# Patient Record
Sex: Female | Born: 1970 | Race: Black or African American | Hispanic: No | Marital: Single | Smoking: Never smoker
Health system: Southern US, Community
[De-identification: ages and names within clinical notes are randomized; demographics above are authoritative.]

---

## 2020-05-07 ENCOUNTER — Other Ambulatory Visit: Payer: Self-pay

## 2020-05-07 ENCOUNTER — Encounter: Payer: Self-pay | Admitting: Podiatry

## 2020-05-07 ENCOUNTER — Ambulatory Visit (INDEPENDENT_AMBULATORY_CARE_PROVIDER_SITE_OTHER): Payer: Managed Care, Other (non HMO) | Admitting: Podiatry

## 2020-05-07 ENCOUNTER — Other Ambulatory Visit: Payer: Self-pay | Admitting: Podiatry

## 2020-05-07 ENCOUNTER — Encounter: Payer: Self-pay | Admitting: *Deleted

## 2020-05-07 ENCOUNTER — Ambulatory Visit (INDEPENDENT_AMBULATORY_CARE_PROVIDER_SITE_OTHER): Payer: Managed Care, Other (non HMO)

## 2020-05-07 DIAGNOSIS — M76822 Posterior tibial tendinitis, left leg: Secondary | ICD-10-CM

## 2020-05-07 DIAGNOSIS — M778 Other enthesopathies, not elsewhere classified: Secondary | ICD-10-CM

## 2020-05-07 MED ORDER — MELOXICAM 15 MG PO TABS: 15.0000 mg | ORAL_TABLET | Freq: Every day | ORAL | 3 refills | Status: AC

## 2020-05-07 MED ORDER — METHYLPREDNISOLONE 4 MG PO TBPK
ORAL_TABLET | ORAL | 0 refills | Status: DC
Start: 2020-05-07 — End: 2020-06-04

## 2020-05-07 NOTE — Progress Notes (Signed)
  Subjective:  Patient ID: Autumn Jackson, female    DOB: 1971-09-18,  MRN: [id number] HPI Chief Complaint  Patient presents with  . Foot Pain    Medial foot left - aching x 6 months, no injury, swelling some, tried OTC meds, KT tape, different shoes-no help, stands at work all day and has to sit after about 3 hours  . New Patient (Initial Visit)    49 y.o. female presents with the above complaint.   ROS: Denies fever chills nausea vomiting muscle aches pains calf pain back pain chest pain shortness of breath.  No past medical history on file.   Current Outpatient Medications:  .  ibuprofen (ADVIL) 800 MG tablet, Take 800 mg by mouth every 8 (eight) hours as needed., Disp: , Rfl:  .  PRESCRIPTION MEDICATION, Unknown antibiotic for tooth infection, Disp: , Rfl:  .  meloxicam (MOBIC) 15 MG tablet, Take 1 tablet (15 mg total) by mouth daily., Disp: 30 tablet, Rfl: 3 .  methylPREDNISolone (MEDROL DOSEPAK) 4 MG TBPK tablet, 6 day dose pack - take as directed, Disp: 21 tablet, Rfl: 0  No Known Allergies Review of Systems Objective:  There were no vitals filed for this visit.  General: Well developed, nourished, in no acute distress, alert and oriented x3   Dermatological: Skin is warm, dry and supple bilateral. Nails x 10 are well maintained; remaining integument appears unremarkable at this time. There are no open sores, no preulcerative lesions, no rash or signs of infection present.  Vascular: Dorsalis Pedis artery and Posterior Tibial artery pedal pulses are 2/4 bilateral with immedate capillary fill time. Pedal hair growth present. No varicosities and no lower extremity edema present bilateral.   Neruologic: Grossly intact via light touch bilateral. Vibratory intact via tuning fork bilateral. Protective threshold with Semmes Wienstein monofilament intact to all pedal sites bilateral. Patellar and Achilles deep tendon reflexes 2+ bilateral. No Babinski or clonus noted bilateral.    Musculoskeletal: No gross boney pedal deformities bilateral. No pain, crepitus, or limitation noted with foot and ankle range of motion bilateral. Muscular strength 5/5 in all groups tested bilateral.  Flexible pes planus bilateral she has pain on palpation of the posterior tibial tendon as it courses beneath the medial malleolus extending to the navicular tuberosity and plantarly.  Inversion against resistance is painful.  Gait: Unassisted, Nonantalgic.    Radiographs:  Radiographs demonstrate pes planus with a soft tissue increase in density along the posterior tibial tendon course headed toward the navicular.  No fractures are identified.  Pes planus does not demonstrate any type of coalitions.  Small plantar distally already calcaneal heel spurs present.  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  Plan: Discussed etiology pathology conservative versus surgical therapies.  I injected subcutaneously today 20 mg of Kenalog 5 mg of Marcaine at the point of maximal tenderness.  Making sure not to inject into the tendon she tolerated the procedure well.  Start her on a Medrol Dosepak to be followed by meloxicam placed her in a short cam walker.  Provided her with a night splint and an ice pack.  I will follow-up with her in 1 month she will have questions or concerns she will notify us immediately.  We did discuss the possible need for orthosis or an MRI depending on the relief she obtains.     Braidyn Scorsone T. [place], [place]

## 2020-05-18 DIAGNOSIS — M79676 Pain in unspecified toe(s): Secondary | ICD-10-CM

## 2020-05-30 ENCOUNTER — Telehealth: Payer: Self-pay | Admitting: Podiatry

## 2020-05-30 NOTE — Telephone Encounter (Signed)
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 She is constantly on her feet. Her job does not have any light duty. She is scheduled to return to see you 7/21. Is is ok for me to do FMLA forms from 6/22-7/22.

## 2020-05-30 NOTE — Telephone Encounter (Signed)
Yes that will be fine and you might want to extend a little.

## 2020-05-31 DIAGNOSIS — M79676 Pain in unspecified toe(s): Secondary | ICD-10-CM

## 2020-06-01 DIAGNOSIS — M79676 Pain in unspecified toe(s): Secondary | ICD-10-CM

## 2020-06-04 ENCOUNTER — Encounter: Payer: Self-pay | Admitting: Podiatry

## 2020-06-04 ENCOUNTER — Other Ambulatory Visit: Payer: Self-pay

## 2020-06-04 ENCOUNTER — Ambulatory Visit (INDEPENDENT_AMBULATORY_CARE_PROVIDER_SITE_OTHER): Payer: Managed Care, Other (non HMO) | Admitting: Podiatry

## 2020-06-04 DIAGNOSIS — M76822 Posterior tibial tendinitis, left leg: Secondary | ICD-10-CM | POA: Diagnosis not present

## 2020-06-04 NOTE — Progress Notes (Signed)
She presents today stating that her posterior tibial tendinitis is about 50 to 60% improved she states is much better she says is much more tolerable than it has been in the past and she is extremely happy.  She states that she continues to take the meloxicam on a regular basis and has a couple refills left.  She denies fever chills nausea vomiting muscle aches and pains denies any new problems.  Objective: Signs are stable she alert oriented x3 still has significant swelling in the medial ankle with pain on palpation of the navicular tuberosity and the inferior medial malleolus area.  Assessment: Posterior tibial tendon dysfunction and posterior tibial tendinitis at its insertion site navicular.  Plan: Discussed etiology pathology and surgical therapies this point time I feel that it is necessary to get her into a orthotic.  I like to use a deep heel cup with a medial navicular flange.  She will follow up with Raiford Noble for this.

## 2020-06-12 ENCOUNTER — Other Ambulatory Visit: Payer: Self-pay

## 2020-06-12 ENCOUNTER — Ambulatory Visit (INDEPENDENT_AMBULATORY_CARE_PROVIDER_SITE_OTHER): Payer: Managed Care, Other (non HMO) | Admitting: Orthotics

## 2020-06-12 DIAGNOSIS — M76822 Posterior tibial tendinitis, left leg: Secondary | ICD-10-CM

## 2020-06-12 DIAGNOSIS — M216X1 Other acquired deformities of right foot: Secondary | ICD-10-CM | POA: Diagnosis not present

## 2020-06-12 DIAGNOSIS — M216X2 Other acquired deformities of left foot: Secondary | ICD-10-CM

## 2020-06-12 NOTE — Progress Notes (Signed)
Cast for f/o to address PTTD Left; plan on deep heel cup w medial flange.

## 2020-07-03 ENCOUNTER — Ambulatory Visit: Payer: Managed Care, Other (non HMO) | Admitting: Orthotics

## 2020-07-03 ENCOUNTER — Other Ambulatory Visit: Payer: Self-pay

## 2020-07-03 DIAGNOSIS — M76822 Posterior tibial tendinitis, left leg: Secondary | ICD-10-CM

## 2020-07-03 NOTE — Progress Notes (Signed)

## 2020-07-12 ENCOUNTER — Encounter: Payer: Self-pay | Admitting: Podiatry

## 2020-07-12 ENCOUNTER — Other Ambulatory Visit: Payer: Self-pay

## 2020-07-12 ENCOUNTER — Ambulatory Visit (INDEPENDENT_AMBULATORY_CARE_PROVIDER_SITE_OTHER): Payer: Managed Care, Other (non HMO) | Admitting: Podiatry

## 2020-07-12 DIAGNOSIS — M76822 Posterior tibial tendinitis, left leg: Secondary | ICD-10-CM | POA: Diagnosis not present

## 2020-07-12 NOTE — Progress Notes (Signed)
Autumn Jackson presents today for follow-up of her posterior tibial tendinitis and tendon dysfunction left.  She has recently picked up her orthotics and has not been back to work as of yet states that as long as I wear the orthotics my foot feels great I have no problems whatsoever.  States that she continues to wear her new balance tennis shoes.  Objective: Vital signs are stable alert oriented x3 left foot demonstrates no edema no pain on palpation of posterior tibial tendon no swelling of the ankle.  Assessment: Well-healing posterior tibial tendinitis and pes planus left with use of orthotics.  Plan: Continued use of orthotics to go back to work Monday.

## 2020-07-17 ENCOUNTER — Encounter: Payer: Self-pay | Admitting: Podiatry

## 2020-07-17 ENCOUNTER — Ambulatory Visit (INDEPENDENT_AMBULATORY_CARE_PROVIDER_SITE_OTHER): Payer: Managed Care, Other (non HMO) | Admitting: Podiatry

## 2020-07-17 ENCOUNTER — Other Ambulatory Visit: Payer: Self-pay

## 2020-07-17 ENCOUNTER — Ambulatory Visit (INDEPENDENT_AMBULATORY_CARE_PROVIDER_SITE_OTHER): Payer: Managed Care, Other (non HMO)

## 2020-07-17 VITALS — BP 110/75 | HR 65 | Temp 97.3°F | Resp 16

## 2020-07-17 DIAGNOSIS — M76822 Posterior tibial tendinitis, left leg: Secondary | ICD-10-CM

## 2020-07-17 DIAGNOSIS — IMO0002 Reserved for concepts with insufficient information to code with codable children: Secondary | ICD-10-CM

## 2020-07-17 MED ORDER — METHYLPREDNISOLONE 4 MG PO TBPK: ORAL_TABLET | ORAL | 0 refills | Status: DC

## 2020-07-17 MED ORDER — OXYCODONE-ACETAMINOPHEN 10-325 MG PO TABS: 1.0000 | ORAL_TABLET | Freq: Three times a day (TID) | ORAL | 0 refills | Status: DC | PRN

## 2020-07-17 NOTE — Progress Notes (Signed)
She presents today as a urgent work in for her right foot.  She states that she went back to work for 1 day after I saw her last week and she worked Monday and now the medial dorsal and the top of her foot are exquisitely tender she states that she cannot even put her foot to the ground without the boot on.  She denies fever chills nausea vomiting muscle aches and pains other than her foot being painful.  The foot is warm warm she says and hardly will fit into her boot.  Objective: Vital signs are stable she is alert and oriented x3.  Pulses are palpable.  She has edema and mild erythema no cellulitis drainage or odor it is not overly hot to the touch but the posterior tibial tendon is nonfunctional.  Assessment: Probable rupture of the posterior tibial tendon left.  No calf pain.  Plan: At this point I am going to send her for an MRI of her left foot.  Rule out posterior tibial tendon tear.  At this point I started her on a Medrol Dosepak and Percocet.  She will continue to wear the boot and use ice.

## 2020-07-18 ENCOUNTER — Telehealth (INDEPENDENT_AMBULATORY_CARE_PROVIDER_SITE_OTHER): Payer: Managed Care, Other (non HMO) | Admitting: Podiatry

## 2020-07-18 ENCOUNTER — Ambulatory Visit: Payer: Managed Care, Other (non HMO) | Admitting: Podiatry

## 2020-07-18 ENCOUNTER — Other Ambulatory Visit: Payer: Self-pay | Admitting: Podiatry

## 2020-07-18 MED ORDER — METHYLPREDNISOLONE 4 MG PO TBPK
ORAL_TABLET | ORAL | 0 refills | Status: DC
Start: 2020-07-18 — End: 2020-08-28

## 2020-07-18 MED ORDER — OXYCODONE-ACETAMINOPHEN 10-325 MG PO TABS: 1.0000 | ORAL_TABLET | Freq: Three times a day (TID) | ORAL | 0 refills | Status: AC | PRN

## 2020-07-18 NOTE — Telephone Encounter (Signed)
Patient called stating she would like prescriptions (Medrol dosepack and Percocet) resent to [facility] in [place], [place]. [address], [place], [place].

## 2020-07-18 NOTE — Telephone Encounter (Signed)
sent 

## 2020-07-20 ENCOUNTER — Other Ambulatory Visit: Payer: Self-pay | Admitting: Podiatry

## 2020-07-25 ENCOUNTER — Ambulatory Visit
Admission: RE | Admit: 2020-07-25 | Discharge: 2020-07-25 | Disposition: A | Discharge status: Home / Self Care | Payer: Managed Care, Other (non HMO) | Source: Ambulatory Visit | Attending: Podiatry | Admitting: Podiatry

## 2020-07-25 ENCOUNTER — Other Ambulatory Visit: Payer: Self-pay

## 2020-07-25 DIAGNOSIS — M76822 Posterior tibial tendinitis, left leg: Secondary | ICD-10-CM

## 2020-07-30 ENCOUNTER — Other Ambulatory Visit: Payer: Self-pay | Admitting: *Deleted

## 2020-07-30 ENCOUNTER — Telehealth: Payer: Self-pay | Admitting: *Deleted

## 2020-07-30 DIAGNOSIS — M76822 Posterior tibial tendinitis, left leg: Secondary | ICD-10-CM

## 2020-07-30 NOTE — Telephone Encounter (Signed)
-----   Message from Elinor Parkinson, [place] sent at 07/25/2020  9:37 AM EDT ----- You could let her know that there appears to be no tear and I would like to consider  PT.  She also has some arthritis.

## 2020-07-30 NOTE — Telephone Encounter (Signed)
Called patient-informed her of results per Dr. Al Corpus. Advised he recommended PT-she would like to give that a try. Will set her up with [facility] in house. Send in order to their office today. They will contact her to schedule.

## 2020-08-07 ENCOUNTER — Encounter: Payer: Managed Care, Other (non HMO) | Admitting: Podiatry

## 2020-08-07 ENCOUNTER — Other Ambulatory Visit: Payer: Self-pay

## 2020-08-07 ENCOUNTER — Ambulatory Visit: Payer: Managed Care, Other (non HMO) | Admitting: Podiatry

## 2020-08-07 NOTE — Progress Notes (Signed)
I have already called patient regarding MRI results at the request of Dr. Al Corpus. He wanted her to start PT. I asked if there was any other reasons for the appointment today and she says no. I explained this appointment wasn't necessary today and to follow back up with him once she is done with PT. She agreed and will call to schedule at that time.

## 2020-08-28 ENCOUNTER — Encounter: Payer: Self-pay | Admitting: Podiatry

## 2020-08-28 ENCOUNTER — Ambulatory Visit (INDEPENDENT_AMBULATORY_CARE_PROVIDER_SITE_OTHER): Payer: Managed Care, Other (non HMO) | Admitting: Podiatry

## 2020-08-28 ENCOUNTER — Other Ambulatory Visit: Payer: Self-pay

## 2020-08-28 DIAGNOSIS — M76822 Posterior tibial tendinitis, left leg: Secondary | ICD-10-CM | POA: Diagnosis not present

## 2020-08-28 NOTE — Progress Notes (Signed)
She presents today for follow-up of her posterior tibial tendinitis states that is doing so much better with physical therapy she says if I twisted her chart it is still hurts but is not nearly as bad as it once was.  Objective: Vital signs are stable she is alert oriented x3 mild edema to the foot.  Still has tenderness on palpation of the posterior tibial tendon.  Still has pain on dorsiflexion plantarflexion inversion and eversion but not nearly as painful as it was previously.  Assessment: Resolving posterior tibial tendinitis.  Plan: Encouraged her to continue with physical therapy and follow-up with me once they have completed.

## 2020-11-15 ENCOUNTER — Other Ambulatory Visit: Payer: Self-pay

## 2020-11-15 ENCOUNTER — Encounter: Payer: Self-pay | Admitting: Podiatry

## 2020-11-15 ENCOUNTER — Ambulatory Visit (INDEPENDENT_AMBULATORY_CARE_PROVIDER_SITE_OTHER): Payer: Managed Care, Other (non HMO) | Admitting: Podiatry

## 2020-11-15 DIAGNOSIS — M76822 Posterior tibial tendinitis, left leg: Secondary | ICD-10-CM | POA: Diagnosis not present

## 2020-11-15 NOTE — Progress Notes (Signed)
She presents today after physical therapy for posterior tibial tendinitis and a severe pes planovalgus.  She states that time she still has instability about the foot but much better range of motion and strength.  She states that when she walks she still like her feels like her foot flattens down along the medial longitudinal arch left over the right.  Objective: Severe pes planus left with some resolving posterior tibial tendinitis she still has some moderate edema she has good inversion against resistance.  No open lesions or wounds.  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  Plan: Discussed etiology pathology conservative surgical therapies at this point I am going to recommend she continue her meloxicam but allow her to get back to work light duty.  I am however going to request that Raiford Noble build up a medial arch on her orthotics the navicular wedge would be good for her I think to help prevent that talonavicular pronation.

## 2020-11-26 ENCOUNTER — Telehealth: Payer: Self-pay | Admitting: Podiatrist

## 2020-11-28 NOTE — Telephone Encounter (Signed)
Patient states she can't return to work until [place] gets her orthotics modified or has a new pair made.

## 2020-12-03 ENCOUNTER — Other Ambulatory Visit: Payer: Managed Care, Other (non HMO) | Admitting: Orthotics

## 2020-12-13 ENCOUNTER — Ambulatory Visit: Payer: Managed Care, Other (non HMO) | Admitting: Orthotics

## 2020-12-13 ENCOUNTER — Other Ambulatory Visit: Payer: Self-pay

## 2020-12-13 DIAGNOSIS — M76822 Posterior tibial tendinitis, left leg: Secondary | ICD-10-CM

## 2020-12-13 NOTE — Progress Notes (Signed)
Adding scaphoid pad to left only

## 2020-12-17 ENCOUNTER — Other Ambulatory Visit: Payer: Self-pay

## 2020-12-17 ENCOUNTER — Ambulatory Visit: Payer: Managed Care, Other (non HMO) | Admitting: Orthotics

## 2020-12-17 DIAGNOSIS — M76822 Posterior tibial tendinitis, left leg: Secondary | ICD-10-CM

## 2020-12-17 NOTE — Progress Notes (Signed)
Picked up adjusted LEFT f/o:  I removed cover and added 1/8" scaphoid pad.

## 2020-12-20 ENCOUNTER — Telehealth: Payer: Self-pay | Admitting: Podiatry

## 2020-12-20 ENCOUNTER — Encounter: Payer: Self-pay | Admitting: Podiatry

## 2020-12-20 NOTE — Telephone Encounter (Signed)
Pt called crying stating today was her first day back at work, and the bottom of her foot is burning. After speaking with Morrie Sheldon, I was given the ok for her to be written out of work until further evaluation. I made her an appt, to see Dr. Samuella Cota tomorrow at 1:15.

## 2020-12-21 ENCOUNTER — Encounter: Payer: Self-pay | Admitting: Podiatry

## 2020-12-21 ENCOUNTER — Other Ambulatory Visit: Payer: Self-pay

## 2020-12-21 ENCOUNTER — Ambulatory Visit (INDEPENDENT_AMBULATORY_CARE_PROVIDER_SITE_OTHER): Payer: Managed Care, Other (non HMO) | Admitting: Podiatry

## 2020-12-21 DIAGNOSIS — M76829 Posterior tibial tendinitis, unspecified leg: Secondary | ICD-10-CM | POA: Diagnosis not present

## 2020-12-21 DIAGNOSIS — Q6652 Congenital pes planus, left foot: Secondary | ICD-10-CM | POA: Diagnosis not present

## 2020-12-21 DIAGNOSIS — M76822 Posterior tibial tendinitis, left leg: Secondary | ICD-10-CM | POA: Diagnosis not present

## 2020-12-21 MED ORDER — METHYLPREDNISOLONE 4 MG PO TBPK: ORAL_TABLET | ORAL | 0 refills | Status: AC

## 2020-12-21 NOTE — Progress Notes (Signed)
  Subjective:  Patient ID: Autumn Jackson, female    DOB: 1971/02/20,  MRN: [id number]  Chief Complaint  Patient presents with  . Tendonitis    Pt states continued plantar midfoot/arch pain which is severe and burning in quality resulting in needing to take frequent breaks at work.     50 y.o. female presents with the above complaint. History confirmed with patient. Has used boot before, now in CMOs. She has had oral medications of both steroid packs x2 and meloxicam without relief.  Objective:  Physical Exam: warm, good capillary refill, no trophic changes or ulcerative lesions, normal DP and PT pulses and normal sensory exam. Left Foot: Tenderness to palpation at the PTT; slight warmth, edema over medial ankle at the PTT. Pes planus noted. Assessment:   1. Posterior tibial tendinitis of left lower extremity   2. Congenital pes planus of left foot   3. PTTD (posterior tibial tendon dysfunction)    Plan:  Patient was evaluated and treated and all questions answered.  Posterior Tibial tendonitis -X-rays reviewed as above -Dispensed Tri-Lock ankle brace.  Patient educated on use  -Rx Medrol pack -Should issues persist order MRI to eval for PT tendon tear  Return in about 3 weeks (around 01/11/2021).

## 2021-01-22 ENCOUNTER — Other Ambulatory Visit: Payer: Self-pay

## 2021-01-22 ENCOUNTER — Ambulatory Visit (INDEPENDENT_AMBULATORY_CARE_PROVIDER_SITE_OTHER): Payer: Managed Care, Other (non HMO) | Admitting: Podiatry

## 2021-01-22 DIAGNOSIS — M76822 Posterior tibial tendinitis, left leg: Secondary | ICD-10-CM

## 2021-01-22 DIAGNOSIS — M66872 Spontaneous rupture of other tendons, left ankle and foot: Secondary | ICD-10-CM | POA: Diagnosis not present

## 2021-01-22 MED ORDER — CELECOXIB 200 MG PO CAPS: 200.0000 mg | ORAL_CAPSULE | Freq: Two times a day (BID) | ORAL | 0 refills | Status: AC

## 2021-01-22 NOTE — Progress Notes (Signed)
  Subjective:  Patient ID: Autumn Jackson, female    DOB: 10-09-1971,  MRN: [id number]  Chief Complaint  Patient presents with  . arch pain    Plantar mid foot arch pain- swelling -redness -painful-burning near the heel-pain is a 5 -87/10    50 y.o. female presents with the above complaint. History confirmed with patient. Brace helps a little bit, the medrol pack did not. Still having pain rated at 6/10.  Objective:  Physical Exam: warm, good capillary refill, no trophic changes or ulcerative lesions, normal DP and PT pulses and normal sensory exam. Left Foot: Tenderness to palpation at the PTT; slight warmth, edema over medial ankle at the PTT. Pes planus noted. Decreased inv strength 4/5 Assessment:   1. Posterior tibial tendinitis of left lower extremity   2. Tibialis posterior tendon tear, nontraumatic, left    Plan:  Patient was evaluated and treated and all questions answered.  Posterior Tibial tendonitis -Continue ankle brace -D/c Meloxicam. Rx celebrex -Order MRI for continued pain and weakness. Has failed oral NSAIDs and steroids, boot immobilization, brace immobilization.  Return in about 3 weeks (around 02/12/2021) for MRI F/u.

## 2021-02-02 ENCOUNTER — Other Ambulatory Visit: Payer: Managed Care, Other (non HMO)

## 2021-02-05 ENCOUNTER — Telehealth: Payer: Self-pay | Admitting: Podiatry

## 2021-02-05 NOTE — Telephone Encounter (Signed)
Called patient Autumn Jackson to schedule appt with Dr. Samuella Cota in the next two weeks so that he can get approval for MRI.

## 2021-02-13 ENCOUNTER — Other Ambulatory Visit: Payer: Self-pay

## 2021-02-13 ENCOUNTER — Ambulatory Visit
Admission: RE | Admit: 2021-02-13 | Discharge: 2021-02-13 | Disposition: A | Discharge status: Home / Self Care | Payer: Managed Care, Other (non HMO) | Source: Ambulatory Visit | Attending: Podiatry | Admitting: Podiatry

## 2021-02-13 DIAGNOSIS — M66872 Spontaneous rupture of other tendons, left ankle and foot: Secondary | ICD-10-CM

## 2021-02-13 DIAGNOSIS — M76822 Posterior tibial tendinitis, left leg: Secondary | ICD-10-CM

## 2021-02-22 ENCOUNTER — Ambulatory Visit (INDEPENDENT_AMBULATORY_CARE_PROVIDER_SITE_OTHER): Payer: Managed Care, Other (non HMO) | Admitting: Podiatry

## 2021-02-22 ENCOUNTER — Other Ambulatory Visit: Payer: Self-pay

## 2021-02-22 DIAGNOSIS — Q6652 Congenital pes planus, left foot: Secondary | ICD-10-CM

## 2021-02-22 DIAGNOSIS — M76822 Posterior tibial tendinitis, left leg: Secondary | ICD-10-CM | POA: Diagnosis not present

## 2021-02-22 DIAGNOSIS — Q6651 Congenital pes planus, right foot: Secondary | ICD-10-CM

## 2021-02-22 NOTE — Progress Notes (Signed)
  Subjective:  Patient ID: Autumn Jackson, female    DOB: Jul 15, 1971,  MRN: [id number]  Chief Complaint  Patient presents with  . Tendonitis      Mri follow up    50 y.o. female presents with the above complaint. States the brace has helped a lot and the medications help too. Had MRI done and here for review.  Objective:  Physical Exam: warm, good capillary refill, no trophic changes or ulcerative lesions, normal DP and PT pulses and normal sensory exam. Left Foot: Tenderness to palpation at the PTT; slight warmth, edema over medial ankle at the PTT. Pes planus noted. Decreased inv strength 4/5 Assessment:   1. Posterior tibial tendinitis of left lower extremity   2. Congenital pes planus of left foot    Plan:  Patient was evaluated and treated and all questions answered.  Posterior Tibial tendonitis -MRI reviewed with patient -Continue ankle brace -Casted for CMOs today. UCBL type for added rearfoot control -Should pain persist consider surgical intervention.  No follow-ups on file.

## 2021-03-29 ENCOUNTER — Other Ambulatory Visit: Payer: Self-pay

## 2021-03-29 ENCOUNTER — Ambulatory Visit (INDEPENDENT_AMBULATORY_CARE_PROVIDER_SITE_OTHER): Payer: Managed Care, Other (non HMO) | Admitting: Podiatry

## 2021-03-29 DIAGNOSIS — M76822 Posterior tibial tendinitis, left leg: Secondary | ICD-10-CM

## 2021-03-29 DIAGNOSIS — Q6652 Congenital pes planus, left foot: Secondary | ICD-10-CM | POA: Diagnosis not present

## 2021-03-29 DIAGNOSIS — M66872 Spontaneous rupture of other tendons, left ankle and foot: Secondary | ICD-10-CM | POA: Diagnosis not present

## 2021-03-29 NOTE — Progress Notes (Signed)
  Subjective:  Patient ID: Autumn Jackson, female    DOB: [birth year],  MRN: [id number]  Chief Complaint  Patient presents with  . Foot Orthotics    Pt states no new concerns, picking up orthotics today   50 y.o. female presents with the above complaint.   Objective:  Physical Exam: warm, good capillary refill, no trophic changes or ulcerative lesions, normal DP and PT pulses and normal sensory exam. Left Foot: Tenderness to palpation at the PTT; slight warmth, edema over medial ankle at the PTT. Pes planus noted. Decreased inv strength 4/5 Assessment:   1. Posterior tibial tendinitis of left lower extremity   2. Congenital pes planus of left foot   3. Tibialis posterior tendon tear, nontraumatic, left    Plan:  Patient was evaluated and treated and all questions answered.  Posterior Tibial tendonitis -Dispensed CMOs today. Educated on break in period. CMOs do fit well to patient's arch.  Return in about 6 weeks (around 05/10/2021) for Orthotic check.

## 2021-04-17 ENCOUNTER — Ambulatory Visit: Payer: Managed Care, Other (non HMO) | Admitting: Family Medicine

## 2021-05-10 ENCOUNTER — Ambulatory Visit (INDEPENDENT_AMBULATORY_CARE_PROVIDER_SITE_OTHER): Payer: Managed Care, Other (non HMO) | Admitting: Podiatry

## 2021-05-10 ENCOUNTER — Telehealth: Payer: Self-pay | Admitting: *Deleted

## 2021-05-10 ENCOUNTER — Other Ambulatory Visit: Payer: Self-pay

## 2021-05-10 ENCOUNTER — Telehealth: Payer: Self-pay

## 2021-05-10 DIAGNOSIS — Z5329 Procedure and treatment not carried out because of patient's decision for other reasons: Secondary | ICD-10-CM

## 2021-05-10 NOTE — Progress Notes (Signed)
Patient left without being seen, states she did not need to be seen.

## 2021-05-10 NOTE — Telephone Encounter (Signed)
Patient is calling and wanted to let the doctor know that the inserts are working well.  She wanted to know what are her limits and will she heal from this problem.Please advise.

## 2021-05-10 NOTE — Telephone Encounter (Signed)
Pt told to gradually taper into orthotics and that she has no restrictions as long as she is not experiencing pain or problems. Pt states she is experiencing some pain and swelling still, even though her orthotics work well for her. Pt advised to come into the office for an exam regarding ongoing issues. Pt states understanding and is agreeable to another appointment.

## 2021-05-11 NOTE — Telephone Encounter (Signed)
Derek Mound spoke to patient yesterday

## 2021-05-15 ENCOUNTER — Other Ambulatory Visit: Payer: Self-pay | Admitting: Family Medicine

## 2021-05-15 DIAGNOSIS — Z1231 Encounter for screening mammogram for malignant neoplasm of breast: Secondary | ICD-10-CM

## 2021-07-10 ENCOUNTER — Ambulatory Visit: Payer: Managed Care, Other (non HMO)

## 2021-08-27 IMAGING — MR MR FOOT*L* W/O CM
5 series · 40 of 40 positions shown · non-contrast
Comparison: Plain films left foot 07/17/2020

CLINICAL DATA: Medial left ankle pain for 9 months. No known
injury.

EXAM:
MRI OF THE LEFT FOOT WITHOUT CONTRAST
TECHNIQUE: Multiplanar, multisequence MR imaging of the ankle was performed. No
intravenous contrast was administered.

[Series 4: T2 fat-sat · axial · 3.0mm · 0.50mm/px · z∈[-85,+48]mm · 10 of 35 slices shown (1 of 2)]
[im 1/35]
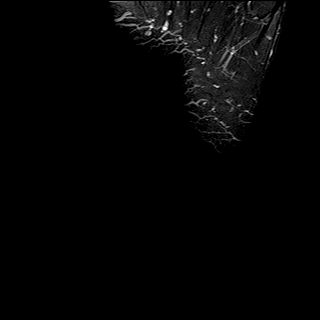
[im 4/35]
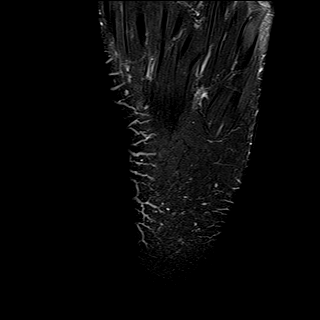
[im 8/35]
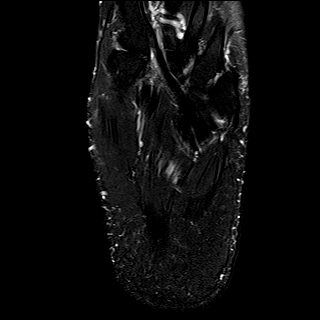
[im 12/35]
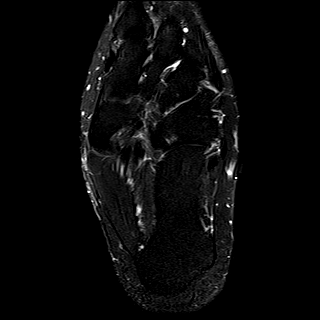
[im 16/35]
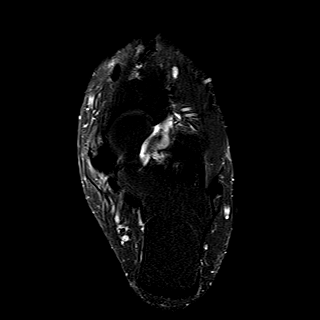
[im 19/35]
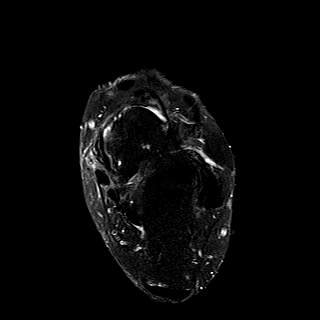
[im 23/35]
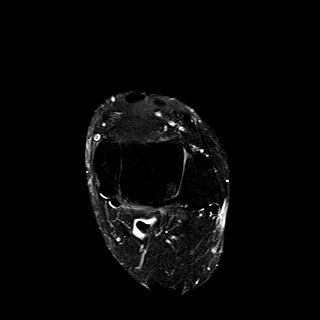
[im 27/35]
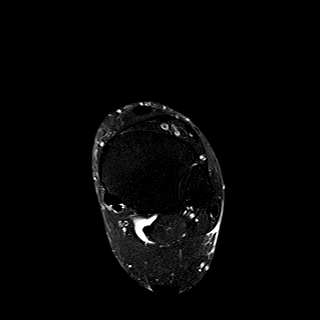
[im 31/35]
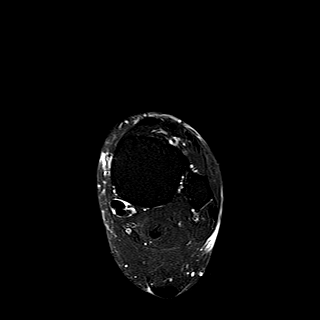
[im 35/35]
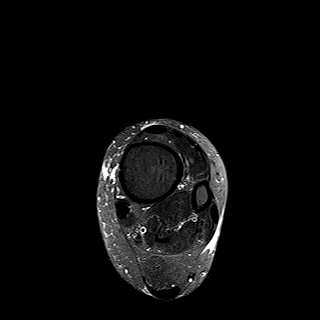

[Series 5: PD fat-sat · axial · 3.0mm · 0.50mm/px · z∈[-85,+48]mm · 10 of 35 slices shown]
[im 1/35]
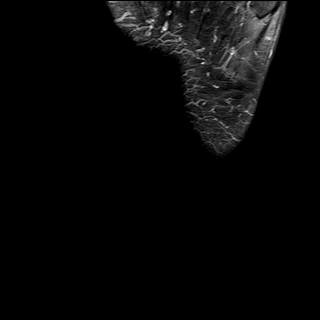
[im 4/35]
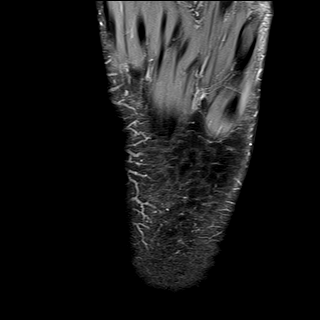
[im 8/35]
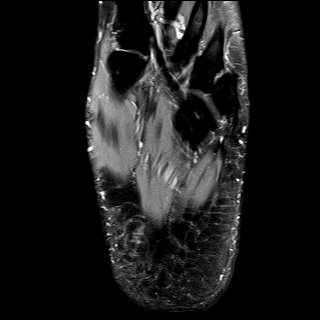
[im 12/35]
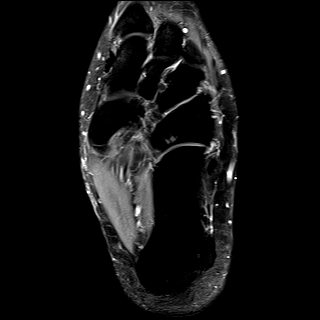
[im 16/35]
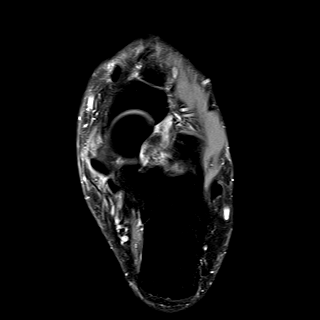
[im 19/35]
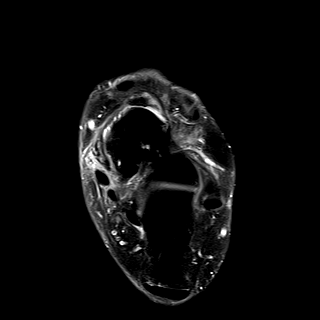
[im 23/35]
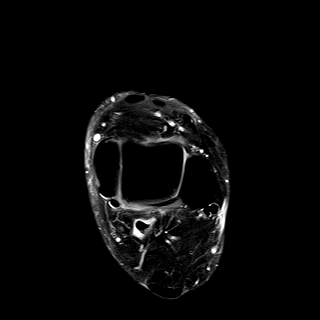
[im 27/35]
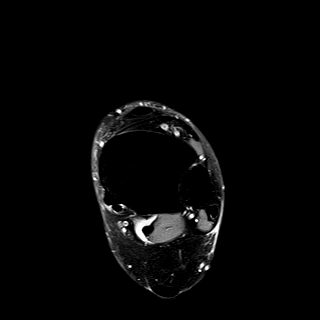
[im 31/35]
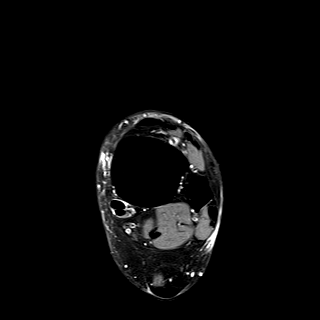
[im 35/35]
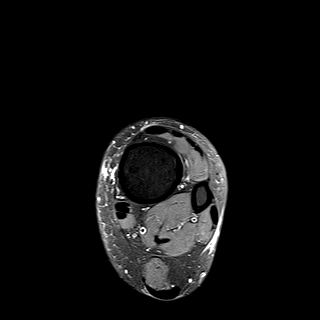

[Series 6: T1 · sagittal · 4.0mm · 0.56mm/px · 5 of 20 slices shown]
[im 1/20]
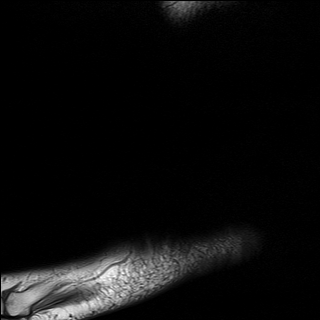
[im 5/20]
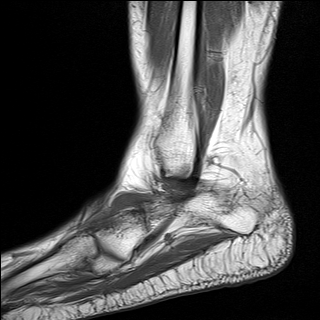
[im 10/20]
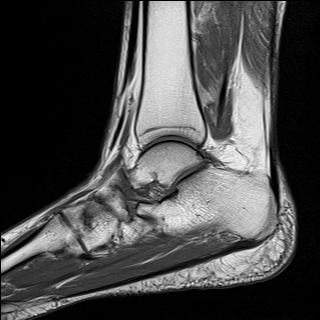
[im 15/20]
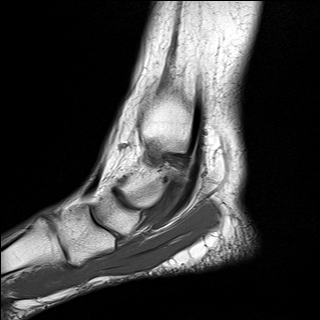
[im 20/20]
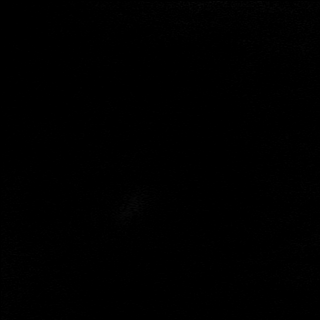

[Series 7: STIR · sagittal · 4.0mm · 0.35mm/px · 5 of 20 slices shown]
[im 1/20]
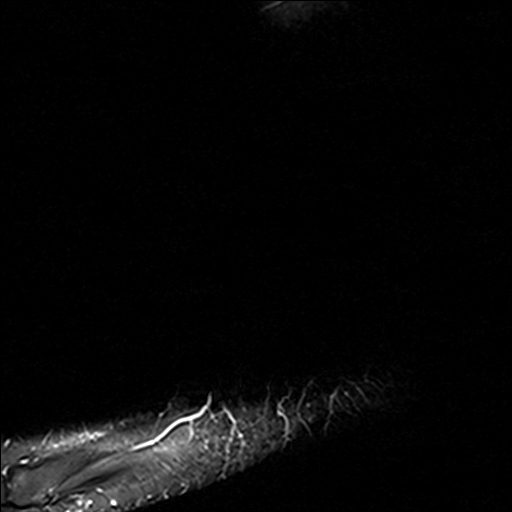
[im 5/20]
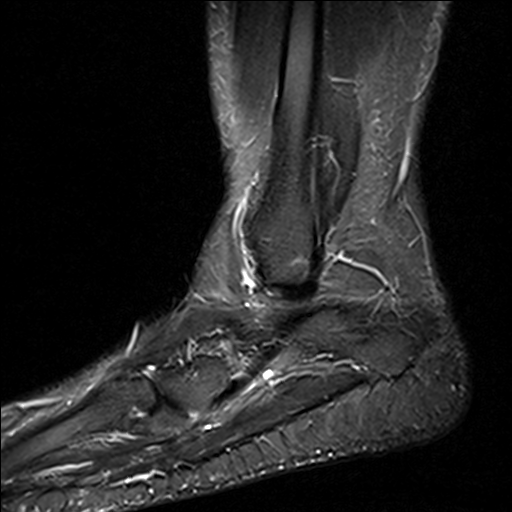
[im 10/20]
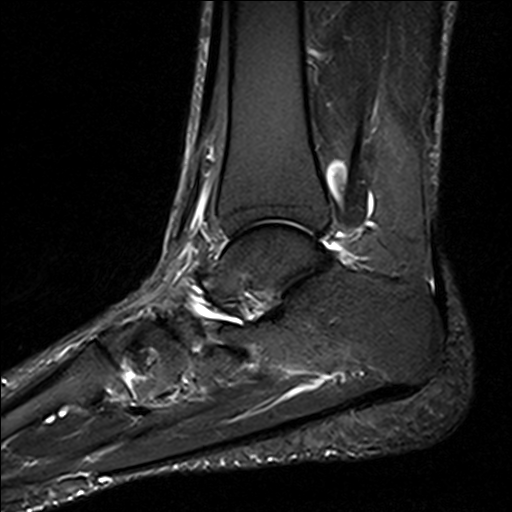
[im 15/20]
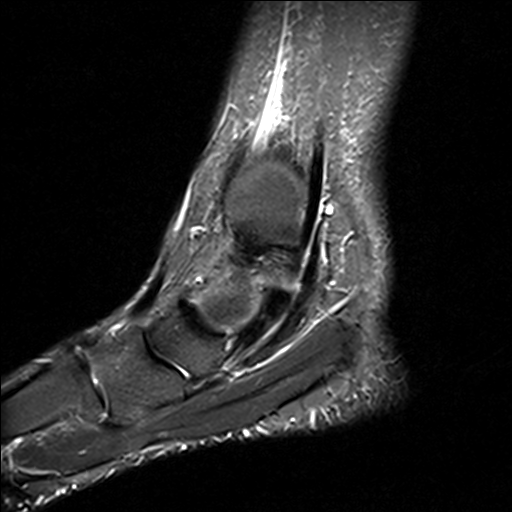
[im 20/20]
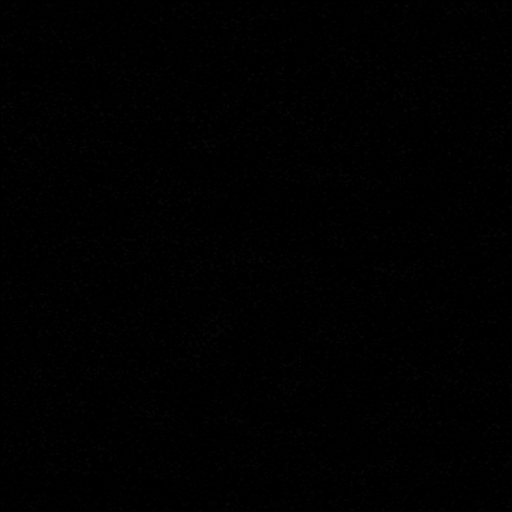

[Series 8: T2 fat-sat · coronal · 3.0mm · 0.50mm/px · 10 of 37 slices shown (2 of 2)]
[im 1/37]
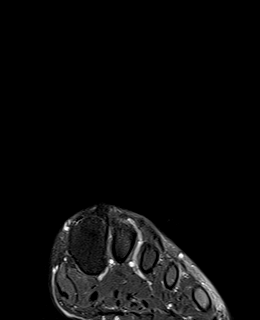
[im 5/37]
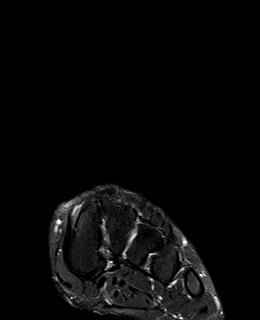
[im 9/37]
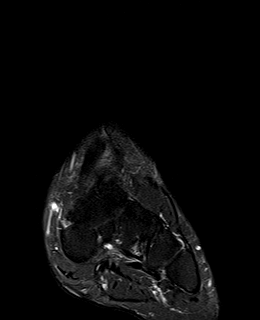
[im 13/37]
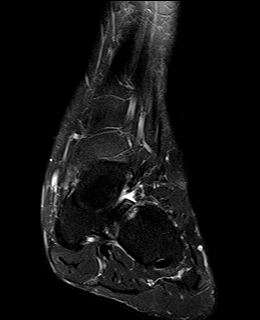
[im 17/37]
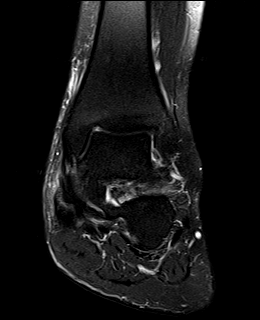
[im 21/37]
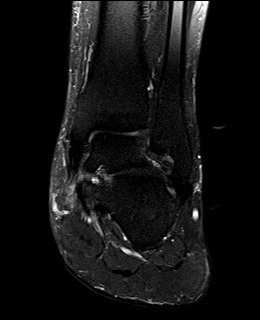
[im 25/37]
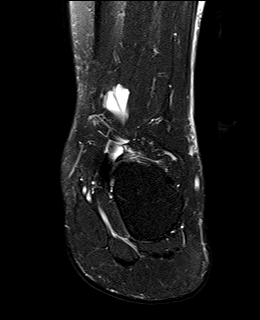
[im 29/37]
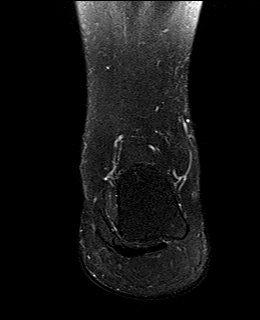
[im 33/37]
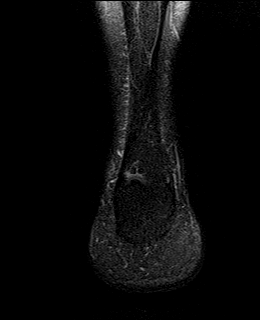
[im 37/37]
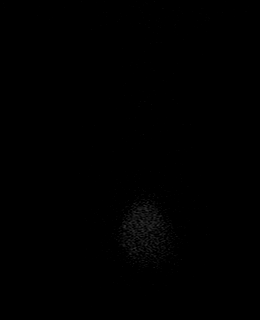

[40 of 40 positions shown; findings below may reference images not displayed]

FINDINGS: TENDONS

Peroneal: Intact.

Posteromedial: Intact. There is mild intrasubstance increased T2
signal in the tibialis posterior tendon at its attachment to the
navicular. A small volume of fluid is seen in the flexor hallucis
longus tendon sheath.

Anterior: Intact.

Achilles: Intact.

Plantar Fascia: Normal.

LIGAMENTS

Lateral: Intact.

Medial: Intact.

CARTILAGE

Ankle Joint: Normal.  No osteochondral lesion of the talar dome.

Subtalar Joints/Sinus Tarsi: Normal.

Bones: No fracture, stress change or worrisome lesion. Pes planus is
identified. The patient has a cornuate type navicular. Mild midfoot
degenerative disease is most notable at the talonavicular joint.

Other: None.
IMPRESSION: Mild tibialis posterior tendinosis without tear. Cornuate navicular
noted.

Mild midfoot osteoarthritis most notable at the talonavicular joint.

Small volume of fluid in the sheath of the flexor hallucis longus
tendon may be due to tenosynovitis without tear.

## 2021-08-29 ENCOUNTER — Telehealth: Payer: Self-pay | Admitting: *Deleted

## 2021-08-29 NOTE — Telephone Encounter (Signed)
Patient is calling and wanted to schedule an appointment for pain and swelling w/ standing on lt foot/ankle too long. She has Health visitor and they did help for a while,will call back to schedule.

## 2021-09-22 ENCOUNTER — Emergency Department (HOSPITAL_COMMUNITY)
Admission: EM | Admit: 2021-09-22 | Discharge: 2021-09-22 | Disposition: A | Discharge status: Home / Self Care | Payer: 59 | Attending: Emergency Medicine | Admitting: Emergency Medicine

## 2021-09-22 ENCOUNTER — Encounter (HOSPITAL_COMMUNITY): Payer: Self-pay | Admitting: Emergency Medicine

## 2021-09-22 ENCOUNTER — Other Ambulatory Visit: Payer: Self-pay

## 2021-09-22 DIAGNOSIS — N939 Abnormal uterine and vaginal bleeding, unspecified: Secondary | ICD-10-CM | POA: Insufficient documentation

## 2021-09-22 DIAGNOSIS — R5383 Other fatigue: Secondary | ICD-10-CM | POA: Insufficient documentation

## 2021-09-22 DIAGNOSIS — R102 Pelvic and perineal pain: Secondary | ICD-10-CM | POA: Diagnosis not present

## 2021-09-22 LAB — CBC WITH DIFFERENTIAL/PLATELET
Abs Immature Granulocytes: 0.01 10*3/uL (ref 0.00–0.07)
Basophils Absolute: 0 10*3/uL (ref 0.0–0.1)
Basophils Relative: 0 %
Eosinophils Absolute: 0.1 10*3/uL (ref 0.0–0.5)
Eosinophils Relative: 2 %
HCT: 34.9 % — ABNORMAL LOW (ref 36.0–46.0)
Hemoglobin: 11.4 g/dL — ABNORMAL LOW (ref 12.0–15.0)
Immature Granulocytes: 0 %
Lymphocytes Relative: 31 %
Lymphs Abs: 1.4 10*3/uL (ref 0.7–4.0)
MCH: 28.4 pg (ref 26.0–34.0)
MCHC: 32.7 g/dL (ref 30.0–36.0)
MCV: 87 fL (ref 80.0–100.0)
Monocytes Absolute: 0.3 10*3/uL (ref 0.1–1.0)
Monocytes Relative: 6 %
Neutro Abs: 2.7 10*3/uL (ref 1.7–7.7)
Neutrophils Relative %: 61 %
Platelets: 117 10*3/uL — ABNORMAL LOW (ref 150–400)
RBC: 4.01 MIL/uL (ref 3.87–5.11)
RDW: 14.1 % (ref 11.5–15.5)
WBC: 4.5 10*3/uL (ref 4.0–10.5)
nRBC: 0 % (ref 0.0–0.2)

## 2021-09-22 LAB — COMPREHENSIVE METABOLIC PANEL
ALT: 14 U/L (ref 0–44)
AST: 16 U/L (ref 15–41)
Albumin: 4.3 g/dL (ref 3.5–5.0)
Alkaline Phosphatase: 53 U/L (ref 38–126)
Anion gap: 7 (ref 5–15)
BUN: 14 mg/dL (ref 6–20)
CO2: 25 mmol/L (ref 22–32)
Calcium: 9.4 mg/dL (ref 8.9–10.3)
Chloride: 106 mmol/L (ref 98–111)
Creatinine, Ser: 0.81 mg/dL (ref 0.44–1.00)
GFR, Estimated: >60 mL/min (ref >60)
Glucose, Bld: 103 mg/dL — ABNORMAL HIGH (ref 70–99)
Potassium: 3.7 mmol/L (ref 3.5–5.1)
Sodium: 138 mmol/L (ref 135–145)
Total Bilirubin: 0.6 mg/dL (ref 0.3–1.2)
Total Protein: 7.7 g/dL (ref 6.5–8.1)

## 2021-09-22 LAB — HCG, QUANTITATIVE, PREGNANCY: hCG, Beta Chain, Quant, S: <1 m[IU]/mL (ref <5)

## 2021-09-22 LAB — TYPE AND SCREEN
ABO/RH(D): O POS
Antibody Screen: NEGATIVE

## 2021-09-22 MED ORDER — KETOROLAC TROMETHAMINE 30 MG/ML IJ SOLN
30.0000 mg | Freq: Once | INTRAMUSCULAR | Status: AC
  Administered 2021-09-22: 30 mg via INTRAVENOUS
  Filled 2021-09-22: qty 1

## 2021-09-22 MED ORDER — SODIUM CHLORIDE 0.9 % IV BOLUS
1000.0000 mL | Freq: Once | INTRAVENOUS | Status: AC
  Administered 2021-09-22: 1000 mL via INTRAVENOUS

## 2021-09-22 NOTE — Discharge Instructions (Addendum)
Your work-up today was reassuring your blood counts are actually improved from the last results I can see which was 2 years ago.  Please monitor your symptoms and follow-up with a OB/GYN.  I recommend drinking plenty of water and taking ibuprofen 600 mg 3 times daily  The ibuprofen will help with the bleeding by constricting the blood vessels in your endometrial tissue.  I have given you 1 dose of IV ibuprofen and Toradol.

## 2021-09-22 NOTE — ED Provider Notes (Signed)
[facility]-EMERGENCY DEPT Provider Note   CSN: [id number] Arrival date & time: 09/22/21  3664     History Chief Complaint  Patient presents with   Vaginal Bleeding    Autumn Jackson is a 50 y.o. female.  HPI Patient is a 50 year old female past medical history significant for ovarian cysts  Patient is presented to the ER today with complaints of now 3 days of vaginal bleeding she states that she has had BRB PV ongoing for the past 2 days prior to today.  She states that it has been more or less constant she states that she is going through pads regularly every couple hours although she states at 1 point the bleeding was more rapid and she was going through a pad in less than an hour.  She denies any lightheadedness or presyncope or near syncope she states that she does feel somewhat fatigued however.  Denies any shortness of breath or chest pain or chest tightness.  No abdominal pain currently but states that she has some pelvic cramping yesterday and the day before.  No nausea vomiting diarrhea no fevers chills cough or congestion.     History reviewed. No pertinent past medical history.  There are no problems to display for this patient.   History reviewed. No pertinent surgical history.   OB History   No obstetric history on file.     No family history on file.  Social History   Tobacco Use   Smoking status: Never   Smokeless tobacco: Never  Substance Use Topics   Alcohol use: Not Currently   Drug use: Never    Home Medications Prior to Admission medications   Medication Sig Start Date End Date Taking? Authorizing Provider  celecoxib (CELEBREX) 200 MG capsule Take 1 capsule (200 mg total) by mouth 2 (two) times daily. 01/22/21   Park Liter, DPM  ibuprofen (ADVIL) 800 MG tablet Take 800 mg by mouth every 8 (eight) hours as needed.    [provider]  meloxicam (MOBIC) 15 MG tablet Take 1 tablet (15 mg total) by mouth daily.  05/07/20   Hyatt, Max T, DPM  methylPREDNISolone (MEDROL DOSEPAK) 4 MG TBPK tablet 6 Day Taper Pack. Take as Directed. 12/21/20   Park Liter, DPM  ofloxacin (OCUFLOX) 0.3 % ophthalmic solution  10/17/20   [provider]    Allergies    Patient has no known allergies.  Review of Systems   Review of Systems  Constitutional:  Negative for chills and fever.  HENT:  Negative for congestion.   Eyes:  Negative for pain.  Respiratory:  Negative for cough and shortness of breath.   Cardiovascular:  Negative for chest pain and leg swelling.  Gastrointestinal:  Negative for abdominal pain and vomiting.  Genitourinary:  Positive for vaginal bleeding. Negative for dysuria.  Musculoskeletal:  Negative for myalgias.  Skin:  Negative for rash.  Neurological:  Negative for dizziness and headaches.   Physical Exam Updated Vital Signs BP 124/66 (BP Location: Left Arm)   Pulse 70   Temp 98 F (36.7 C) (Oral)   Resp 18   Ht 5\' 8"  (1.727 m)   Wt 115.7 kg   LMP 09/20/2021 (Exact Date)   SpO2 100%   BMI 38.77 kg/m   Physical Exam Vitals and nursing note reviewed.  Constitutional:      General: She is not in acute distress. HENT:     Head: Normocephalic and atraumatic.     Nose:  Nose normal.  Eyes:     General: No scleral icterus. Cardiovascular:     Rate and Rhythm: Normal rate and regular rhythm.     Pulses: Normal pulses.     Heart sounds: Normal heart sounds.  Pulmonary:     Effort: Pulmonary effort is normal. No respiratory distress.     Breath sounds: No wheezing.  Abdominal:     Palpations: Abdomen is soft.     Tenderness: There is no abdominal tenderness. There is no right CVA tenderness, left CVA tenderness, guarding or rebound.  Genitourinary:    Comments: Deferred by patient Musculoskeletal:     Cervical back: Normal range of motion.     Right lower leg: No edema.     Left lower leg: No edema.  Skin:    General: Skin is warm and dry.     Capillary Refill:  Capillary refill takes less than 2 seconds.  Neurological:     Mental Status: She is alert. Mental status is at baseline.  Psychiatric:        Mood and Affect: Mood normal.        Behavior: Behavior normal.    ED Results / Procedures / Treatments   Labs (all labs ordered are listed, but only abnormal results are displayed) Labs Reviewed  CBC WITH DIFFERENTIAL/PLATELET - Abnormal; Notable for the following components:      Result Value   Hemoglobin 11.4 (*)    HCT 34.9 (*)    Platelets 117 (*)    All other components within normal limits  COMPREHENSIVE METABOLIC PANEL - Abnormal; Notable for the following components:   Glucose, Bld 103 (*)    All other components within normal limits  HCG, QUANTITATIVE, PREGNANCY  I-STAT BETA HCG BLOOD, ED ([facility], [facility], [facility] ONLY)  TYPE AND SCREEN  ABO/RH    EKG None  Radiology No results found.  Procedures Procedures   Medications Ordered in ED Medications  sodium chloride 0.9 % bolus 1,000 mL (0 mLs Intravenous Stopped 09/22/21 0909)  ketorolac (TORADOL) 30 MG/ML injection 30 mg (30 mg Intravenous Given 09/22/21 1101)    ED Course  I have reviewed the triage vital signs and the nursing notes.  Pertinent labs & imaging results that were available during my care of the patient were reviewed by me and considered in my medical decision making (see chart for details).  Clinical Course as of 09/22/21 1423  Sun Sep 22, 2021  0859 Hemoglobin(!): 11.4 [WF]    Clinical Course User Index [WF] Gailen Shelter, [place]   MDM Rules/Calculators/A&P                          Patient with history of anemia 50 year old female presents to the ER today complaining of vaginal bleeding.  She states she has no pain associated with this she did have some cramping yesterday and the day before she denies any lightheadedness syncope or near syncope.  No chest pain or shortness of breath.  She informs me that currently she is going through a pad approximately every  hour.  She has been seen by OB/GYN and had ultrasound that showed a ovarian cyst.  9.3   Hgb on 12/13/2018 11.4 Hgb today  She states she feels much improved after fluids given 1 dose of Toradol for improvement of bleeding she has not had any cramping or pain here in the ER doubt torsion ectopic or cyst rupture.  CMP unremarkable.  CBC  with mild anemia no leukocytosis.  hCG negative for pregnancy.  Type and screen O+.  Patient ambulatory at time of discharge with follow-up with OB/GYN.  I offered resources for this however she would prefer to follow-up with private OB/GYN of her choice.  Return precautions given.  Final Clinical Impression(s) / ED Diagnoses Final diagnoses:  Vaginal bleeding    Rx / DC Orders ED Discharge Orders     None        Gailen Shelter, [place] 09/22/21 1517    Lorre Nick, MD 09/24/21 1551

## 2021-09-22 NOTE — ED Triage Notes (Signed)
Patient states 2 days ago she began having vaginal bleeding. Patient states she has not had a period in 3 months, and thought her period was "playing catch up." Patient states bleeding is increasing and this morning has a large gush of blood and became light headed. Patient states she is soaking through a pad in 10 minutes.

## 2021-09-23 DIAGNOSIS — M545 Low back pain, unspecified: Secondary | ICD-10-CM | POA: Diagnosis not present

## 2021-09-23 DIAGNOSIS — M25572 Pain in left ankle and joints of left foot: Secondary | ICD-10-CM | POA: Diagnosis not present

## 2021-09-23 DIAGNOSIS — M5451 Vertebrogenic low back pain: Secondary | ICD-10-CM | POA: Diagnosis not present

## 2021-10-28 DIAGNOSIS — M25572 Pain in left ankle and joints of left foot: Secondary | ICD-10-CM | POA: Diagnosis not present

## 2021-10-28 DIAGNOSIS — M5416 Radiculopathy, lumbar region: Secondary | ICD-10-CM | POA: Diagnosis not present

## 2021-11-05 DIAGNOSIS — M5451 Vertebrogenic low back pain: Secondary | ICD-10-CM | POA: Diagnosis not present

## 2021-11-05 DIAGNOSIS — M25572 Pain in left ankle and joints of left foot: Secondary | ICD-10-CM | POA: Diagnosis not present

## 2021-11-12 DIAGNOSIS — M2142 Flat foot [pes planus] (acquired), left foot: Secondary | ICD-10-CM | POA: Diagnosis not present

## 2021-11-12 DIAGNOSIS — M76822 Posterior tibial tendinitis, left leg: Secondary | ICD-10-CM | POA: Diagnosis not present

## 2021-11-25 DIAGNOSIS — M25572 Pain in left ankle and joints of left foot: Secondary | ICD-10-CM | POA: Diagnosis not present

## 2021-11-25 DIAGNOSIS — M76822 Posterior tibial tendinitis, left leg: Secondary | ICD-10-CM | POA: Diagnosis not present

## 2021-11-27 DIAGNOSIS — M76822 Posterior tibial tendinitis, left leg: Secondary | ICD-10-CM | POA: Diagnosis not present

## 2021-11-27 DIAGNOSIS — M25572 Pain in left ankle and joints of left foot: Secondary | ICD-10-CM | POA: Diagnosis not present

## 2021-12-05 DIAGNOSIS — M25572 Pain in left ankle and joints of left foot: Secondary | ICD-10-CM | POA: Diagnosis not present

## 2021-12-05 DIAGNOSIS — M76822 Posterior tibial tendinitis, left leg: Secondary | ICD-10-CM | POA: Diagnosis not present

## 2021-12-09 DIAGNOSIS — M25572 Pain in left ankle and joints of left foot: Secondary | ICD-10-CM | POA: Diagnosis not present

## 2021-12-09 DIAGNOSIS — M76822 Posterior tibial tendinitis, left leg: Secondary | ICD-10-CM | POA: Diagnosis not present

## 2021-12-11 DIAGNOSIS — M5416 Radiculopathy, lumbar region: Secondary | ICD-10-CM | POA: Diagnosis not present

## 2021-12-12 DIAGNOSIS — M5451 Vertebrogenic low back pain: Secondary | ICD-10-CM | POA: Diagnosis not present

## 2021-12-13 DIAGNOSIS — M76822 Posterior tibial tendinitis, left leg: Secondary | ICD-10-CM | POA: Diagnosis not present

## 2021-12-13 DIAGNOSIS — M25572 Pain in left ankle and joints of left foot: Secondary | ICD-10-CM | POA: Diagnosis not present

## 2021-12-16 DIAGNOSIS — M76822 Posterior tibial tendinitis, left leg: Secondary | ICD-10-CM | POA: Diagnosis not present

## 2021-12-16 DIAGNOSIS — M25572 Pain in left ankle and joints of left foot: Secondary | ICD-10-CM | POA: Diagnosis not present

## 2021-12-24 DIAGNOSIS — M76822 Posterior tibial tendinitis, left leg: Secondary | ICD-10-CM | POA: Diagnosis not present

## 2021-12-24 DIAGNOSIS — M25572 Pain in left ankle and joints of left foot: Secondary | ICD-10-CM | POA: Diagnosis not present

## 2021-12-25 DIAGNOSIS — M5451 Vertebrogenic low back pain: Secondary | ICD-10-CM | POA: Diagnosis not present

## 2022-01-02 DIAGNOSIS — M5451 Vertebrogenic low back pain: Secondary | ICD-10-CM | POA: Diagnosis not present

## 2022-02-10 DIAGNOSIS — M5416 Radiculopathy, lumbar region: Secondary | ICD-10-CM | POA: Diagnosis not present
